# Patient Record
Sex: Female | Born: 1995
Health system: Southern US, Community
[De-identification: ages and names within clinical notes are randomized; demographics above are authoritative.]

## PROBLEM LIST (undated history)

## (undated) DIAGNOSIS — J302 Other seasonal allergic rhinitis: Secondary | ICD-10-CM

## (undated) HISTORY — PX: ADENOIDECTOMY: SUR15

---
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
## 2017-01-08 DIAGNOSIS — R5383 Other fatigue: Secondary | ICD-10-CM | POA: Diagnosis not present

## 2017-07-03 DIAGNOSIS — Z01419 Encounter for gynecological examination (general) (routine) without abnormal findings: Secondary | ICD-10-CM | POA: Diagnosis not present

## 2017-07-21 ENCOUNTER — Encounter: Payer: Self-pay | Admitting: Genetic Counselor

## 2017-07-21 ENCOUNTER — Telehealth: Payer: Self-pay | Admitting: Genetic Counselor

## 2017-07-21 NOTE — Telephone Encounter (Signed)
Genetic counseling appt has been scheduled for the pt to see Maylon Cos on 10/3 at 1pm. Address and insurance verified. Letter mailed to the pt and faxed to the referring.

## 2017-08-06 ENCOUNTER — Encounter: Payer: Self-pay | Admitting: Genetic Counselor

## 2017-08-06 ENCOUNTER — Other Ambulatory Visit: Payer: Self-pay

## 2017-08-31 DIAGNOSIS — Z23 Encounter for immunization: Secondary | ICD-10-CM | POA: Diagnosis not present

## 2017-09-23 DIAGNOSIS — H6120 Impacted cerumen, unspecified ear: Secondary | ICD-10-CM | POA: Diagnosis not present

## 2017-09-23 DIAGNOSIS — L989 Disorder of the skin and subcutaneous tissue, unspecified: Secondary | ICD-10-CM | POA: Diagnosis not present

## 2017-12-29 DIAGNOSIS — N760 Acute vaginitis: Secondary | ICD-10-CM | POA: Diagnosis not present

## 2018-02-05 DIAGNOSIS — L659 Nonscarring hair loss, unspecified: Secondary | ICD-10-CM | POA: Diagnosis not present

## 2018-02-05 DIAGNOSIS — L219 Seborrheic dermatitis, unspecified: Secondary | ICD-10-CM | POA: Diagnosis not present

## 2018-03-03 DIAGNOSIS — L659 Nonscarring hair loss, unspecified: Secondary | ICD-10-CM | POA: Diagnosis not present

## 2018-03-20 DIAGNOSIS — D649 Anemia, unspecified: Secondary | ICD-10-CM | POA: Diagnosis not present

## 2018-03-20 DIAGNOSIS — E559 Vitamin D deficiency, unspecified: Secondary | ICD-10-CM | POA: Diagnosis not present

## 2018-05-13 DIAGNOSIS — R51 Headache: Secondary | ICD-10-CM | POA: Diagnosis not present

## 2018-05-13 DIAGNOSIS — M9902 Segmental and somatic dysfunction of thoracic region: Secondary | ICD-10-CM | POA: Diagnosis not present

## 2018-05-13 DIAGNOSIS — M9901 Segmental and somatic dysfunction of cervical region: Secondary | ICD-10-CM | POA: Diagnosis not present

## 2018-05-15 DIAGNOSIS — M9901 Segmental and somatic dysfunction of cervical region: Secondary | ICD-10-CM | POA: Diagnosis not present

## 2018-05-15 DIAGNOSIS — M9902 Segmental and somatic dysfunction of thoracic region: Secondary | ICD-10-CM | POA: Diagnosis not present

## 2018-05-15 DIAGNOSIS — R51 Headache: Secondary | ICD-10-CM | POA: Diagnosis not present

## 2018-05-19 DIAGNOSIS — M9901 Segmental and somatic dysfunction of cervical region: Secondary | ICD-10-CM | POA: Diagnosis not present

## 2018-05-19 DIAGNOSIS — R51 Headache: Secondary | ICD-10-CM | POA: Diagnosis not present

## 2018-05-19 DIAGNOSIS — M9902 Segmental and somatic dysfunction of thoracic region: Secondary | ICD-10-CM | POA: Diagnosis not present

## 2018-05-21 DIAGNOSIS — R51 Headache: Secondary | ICD-10-CM | POA: Diagnosis not present

## 2018-05-21 DIAGNOSIS — M542 Cervicalgia: Secondary | ICD-10-CM | POA: Diagnosis not present

## 2018-05-21 DIAGNOSIS — M9901 Segmental and somatic dysfunction of cervical region: Secondary | ICD-10-CM | POA: Diagnosis not present

## 2018-05-22 DIAGNOSIS — R51 Headache: Secondary | ICD-10-CM | POA: Diagnosis not present

## 2018-05-22 DIAGNOSIS — M9901 Segmental and somatic dysfunction of cervical region: Secondary | ICD-10-CM | POA: Diagnosis not present

## 2018-05-22 DIAGNOSIS — M9902 Segmental and somatic dysfunction of thoracic region: Secondary | ICD-10-CM | POA: Diagnosis not present

## 2018-05-25 DIAGNOSIS — R51 Headache: Secondary | ICD-10-CM | POA: Diagnosis not present

## 2018-05-25 DIAGNOSIS — M9901 Segmental and somatic dysfunction of cervical region: Secondary | ICD-10-CM | POA: Diagnosis not present

## 2018-05-25 DIAGNOSIS — M9902 Segmental and somatic dysfunction of thoracic region: Secondary | ICD-10-CM | POA: Diagnosis not present

## 2018-05-27 DIAGNOSIS — M9901 Segmental and somatic dysfunction of cervical region: Secondary | ICD-10-CM | POA: Diagnosis not present

## 2018-05-27 DIAGNOSIS — M9902 Segmental and somatic dysfunction of thoracic region: Secondary | ICD-10-CM | POA: Diagnosis not present

## 2018-05-27 DIAGNOSIS — R51 Headache: Secondary | ICD-10-CM | POA: Diagnosis not present

## 2018-05-28 DIAGNOSIS — M9901 Segmental and somatic dysfunction of cervical region: Secondary | ICD-10-CM | POA: Diagnosis not present

## 2018-05-28 DIAGNOSIS — M542 Cervicalgia: Secondary | ICD-10-CM | POA: Diagnosis not present

## 2018-05-28 DIAGNOSIS — R51 Headache: Secondary | ICD-10-CM | POA: Diagnosis not present

## 2018-06-03 DIAGNOSIS — M9901 Segmental and somatic dysfunction of cervical region: Secondary | ICD-10-CM | POA: Diagnosis not present

## 2018-06-03 DIAGNOSIS — M9902 Segmental and somatic dysfunction of thoracic region: Secondary | ICD-10-CM | POA: Diagnosis not present

## 2018-06-03 DIAGNOSIS — R51 Headache: Secondary | ICD-10-CM | POA: Diagnosis not present

## 2018-06-04 DIAGNOSIS — R51 Headache: Secondary | ICD-10-CM | POA: Diagnosis not present

## 2018-06-04 DIAGNOSIS — M9901 Segmental and somatic dysfunction of cervical region: Secondary | ICD-10-CM | POA: Diagnosis not present

## 2018-06-04 DIAGNOSIS — M542 Cervicalgia: Secondary | ICD-10-CM | POA: Diagnosis not present

## 2018-06-10 DIAGNOSIS — M9901 Segmental and somatic dysfunction of cervical region: Secondary | ICD-10-CM | POA: Diagnosis not present

## 2018-06-10 DIAGNOSIS — M9902 Segmental and somatic dysfunction of thoracic region: Secondary | ICD-10-CM | POA: Diagnosis not present

## 2018-06-10 DIAGNOSIS — R51 Headache: Secondary | ICD-10-CM | POA: Diagnosis not present

## 2018-06-11 DIAGNOSIS — M542 Cervicalgia: Secondary | ICD-10-CM | POA: Diagnosis not present

## 2018-06-11 DIAGNOSIS — M9901 Segmental and somatic dysfunction of cervical region: Secondary | ICD-10-CM | POA: Diagnosis not present

## 2018-06-11 DIAGNOSIS — R51 Headache: Secondary | ICD-10-CM | POA: Diagnosis not present

## 2018-06-18 DIAGNOSIS — M9902 Segmental and somatic dysfunction of thoracic region: Secondary | ICD-10-CM | POA: Diagnosis not present

## 2018-06-18 DIAGNOSIS — R51 Headache: Secondary | ICD-10-CM | POA: Diagnosis not present

## 2018-06-18 DIAGNOSIS — M9901 Segmental and somatic dysfunction of cervical region: Secondary | ICD-10-CM | POA: Diagnosis not present

## 2018-06-22 DIAGNOSIS — M9901 Segmental and somatic dysfunction of cervical region: Secondary | ICD-10-CM | POA: Diagnosis not present

## 2018-06-22 DIAGNOSIS — R51 Headache: Secondary | ICD-10-CM | POA: Diagnosis not present

## 2018-06-22 DIAGNOSIS — M9902 Segmental and somatic dysfunction of thoracic region: Secondary | ICD-10-CM | POA: Diagnosis not present

## 2018-07-07 DIAGNOSIS — M9902 Segmental and somatic dysfunction of thoracic region: Secondary | ICD-10-CM | POA: Diagnosis not present

## 2018-07-07 DIAGNOSIS — R51 Headache: Secondary | ICD-10-CM | POA: Diagnosis not present

## 2018-07-07 DIAGNOSIS — M9901 Segmental and somatic dysfunction of cervical region: Secondary | ICD-10-CM | POA: Diagnosis not present

## 2018-07-14 DIAGNOSIS — R51 Headache: Secondary | ICD-10-CM | POA: Diagnosis not present

## 2018-07-14 DIAGNOSIS — M9901 Segmental and somatic dysfunction of cervical region: Secondary | ICD-10-CM | POA: Diagnosis not present

## 2018-07-14 DIAGNOSIS — M9902 Segmental and somatic dysfunction of thoracic region: Secondary | ICD-10-CM | POA: Diagnosis not present

## 2018-07-21 DIAGNOSIS — M9901 Segmental and somatic dysfunction of cervical region: Secondary | ICD-10-CM | POA: Diagnosis not present

## 2018-07-21 DIAGNOSIS — M9902 Segmental and somatic dysfunction of thoracic region: Secondary | ICD-10-CM | POA: Diagnosis not present

## 2018-07-21 DIAGNOSIS — R51 Headache: Secondary | ICD-10-CM | POA: Diagnosis not present

## 2018-07-28 DIAGNOSIS — M9901 Segmental and somatic dysfunction of cervical region: Secondary | ICD-10-CM | POA: Diagnosis not present

## 2018-07-28 DIAGNOSIS — M9902 Segmental and somatic dysfunction of thoracic region: Secondary | ICD-10-CM | POA: Diagnosis not present

## 2018-07-28 DIAGNOSIS — R51 Headache: Secondary | ICD-10-CM | POA: Diagnosis not present

## 2018-08-06 DIAGNOSIS — M9901 Segmental and somatic dysfunction of cervical region: Secondary | ICD-10-CM | POA: Diagnosis not present

## 2018-08-06 DIAGNOSIS — M9902 Segmental and somatic dysfunction of thoracic region: Secondary | ICD-10-CM | POA: Diagnosis not present

## 2018-08-06 DIAGNOSIS — R51 Headache: Secondary | ICD-10-CM | POA: Diagnosis not present

## 2018-08-13 DIAGNOSIS — R51 Headache: Secondary | ICD-10-CM | POA: Diagnosis not present

## 2018-08-13 DIAGNOSIS — M9901 Segmental and somatic dysfunction of cervical region: Secondary | ICD-10-CM | POA: Diagnosis not present

## 2018-08-13 DIAGNOSIS — M9902 Segmental and somatic dysfunction of thoracic region: Secondary | ICD-10-CM | POA: Diagnosis not present

## 2018-08-21 DIAGNOSIS — M9901 Segmental and somatic dysfunction of cervical region: Secondary | ICD-10-CM | POA: Diagnosis not present

## 2018-08-21 DIAGNOSIS — M9902 Segmental and somatic dysfunction of thoracic region: Secondary | ICD-10-CM | POA: Diagnosis not present

## 2018-08-21 DIAGNOSIS — R51 Headache: Secondary | ICD-10-CM | POA: Diagnosis not present

## 2018-09-05 ENCOUNTER — Other Ambulatory Visit: Payer: Self-pay

## 2018-09-05 ENCOUNTER — Encounter (HOSPITAL_BASED_OUTPATIENT_CLINIC_OR_DEPARTMENT_OTHER): Payer: Self-pay | Admitting: *Deleted

## 2018-09-05 DIAGNOSIS — Z79899 Other long term (current) drug therapy: Secondary | ICD-10-CM | POA: Diagnosis not present

## 2018-09-05 DIAGNOSIS — N764 Abscess of vulva: Secondary | ICD-10-CM | POA: Diagnosis not present

## 2018-09-05 DIAGNOSIS — N7689 Other specified inflammation of vagina and vulva: Secondary | ICD-10-CM | POA: Diagnosis not present

## 2018-09-05 DIAGNOSIS — J301 Allergic rhinitis due to pollen: Secondary | ICD-10-CM | POA: Diagnosis not present

## 2018-09-05 NOTE — ED Triage Notes (Signed)
Pt reports ?insect bite to right labia today

## 2018-09-06 ENCOUNTER — Emergency Department (HOSPITAL_BASED_OUTPATIENT_CLINIC_OR_DEPARTMENT_OTHER)
Admission: EM | Admit: 2018-09-06 | Discharge: 2018-09-06 | Disposition: A | Discharge status: Home / Self Care | Payer: 59 | Attending: Emergency Medicine | Admitting: Emergency Medicine

## 2018-09-06 DIAGNOSIS — N764 Abscess of vulva: Secondary | ICD-10-CM

## 2018-09-06 MED ORDER — CEPHALEXIN 250 MG PO CAPS
500.0000 mg | ORAL_CAPSULE | Freq: Once | ORAL | Status: AC
  Administered 2018-09-06: 500 mg via ORAL
  Filled 2018-09-06: qty 2

## 2018-09-06 MED ORDER — CEPHALEXIN 500 MG PO CAPS: 500.0000 mg | ORAL_CAPSULE | Freq: Four times a day (QID) | ORAL | 0 refills | Status: DC

## 2018-09-06 NOTE — ED Provider Notes (Signed)
TIME SEEN: 1:09 AM  CHIEF COMPLAINT: Labial swelling  HPI: Patient is a 22 year old female who presents to the emergency department with right-sided labial swelling that she noticed today.  Initially told triage nurse that she thinks she was bit by an insect.  She does not remember anything biting her in this area but just thought that this was a possibility given the swelling.  No vaginal bleeding or discharge.  Last menstrual period was August 15, 2018.  Has never had a history of the same.  ROS: See HPI Constitutional: no fever  Eyes: no drainage  ENT: no runny nose   Cardiovascular:  no chest pain  Resp: no SOB  GI: no vomiting GU: no dysuria Integumentary: no rash  Allergy: no hives  Musculoskeletal: no leg swelling  Neurological: no slurred speech ROS otherwise negative  PAST MEDICAL HISTORY/PAST SURGICAL HISTORY:  Past Medical History:  Diagnosis Date  . Seasonal allergies     MEDICATIONS:  Prior to Admission medications   Medication Sig Start Date End Date Taking? Authorizing Provider  fexofenadine (ALLEGRA) 180 MG tablet Take 180 mg by mouth daily.   Yes [provider]  IRON PO Take by mouth.   Yes [provider]  Multiple Vitamin (MULTIVITAMIN) capsule Take 1 capsule by mouth daily.   Yes [provider]  Cetirizine HCl (ZYRTEC PO) Take by mouth.    [provider]  methocarbamol (ROBAXIN) 500 MG tablet Take 1 tablet (500 mg total) by mouth every 6 (six) hours as needed for muscle spasms. 08/04/16   Alene Mires, NP    ALLERGIES:  Allergies  Allergen Reactions  . Hydrocodone Other (See Comments)    Hives    SOCIAL HISTORY:  Social History   Tobacco Use  . Smoking status: Never Smoker  . Smokeless tobacco: Never Used  Substance Use Topics  . Alcohol use: Yes    Comment: occasional    FAMILY HISTORY: No family history on file.  EXAM: BP 123/83 (BP Location: Left Arm)   Pulse 83   Temp 98.3 F (36.8 C)  (Oral)   Resp 16   Ht 5\' 4"  (1.626 m)   Wt 49.9 kg   LMP 08/15/2018 (Approximate)   SpO2 100%   BMI 18.88 kg/m  CONSTITUTIONAL: Alert and oriented and responds appropriately to questions. Well-appearing; well-nourished HEAD: Normocephalic EYES: Conjunctivae clear, pupils appear equal, EOMI ENT: normal nose; moist mucous membranes NECK: Supple, no meningismus, no nuchal rigidity, no LAD  CARD: RRR; S1 and S2 appreciated; no murmurs, no clicks, no rubs, no gallops RESP: Normal chest excursion without splinting or tachypnea; breath sounds clear and equal bilaterally; no wheezes, no rhonchi, no rales, no hypoxia or respiratory distress, speaking full sentences ABD/GI: Normal bowel sounds; non-distended; soft, non-tender, no rebound, no guarding, no peritoneal signs, no hepatosplenomegaly GU: Patient has swelling to the right labia majora without redness, fluctuance, induration.  No vaginal bleeding or discharge on examination. BACK:  The back appears normal and is non-tender to palpation, there is no CVA tenderness EXT: Normal ROM in all joints; non-tender to palpation; no edema; normal capillary refill; no cyanosis, no calf tenderness or swelling    SKIN: Normal color for age and race; warm; no rash NEURO: Moves all extremities equally PSYCH: The patient's mood and manner are appropriate. Grooming and personal hygiene are appropriate.  MEDICAL DECISION MAKING: Patient here with likely start of an early abscess.  She does have swelling of the labia majora but no obvious  abscess for drainage at this time.  There is no fluctuance or signs of cellulitis.  Will discharge on Keflex and given outpatient close follow-up.  Recommended alternating Tylenol and Motrin for pain.  Discussed warm sitz bath's and return precautions.  Patient and mother comfortable with this plan.  At this time, I do not feel there is any life-threatening condition present. I have reviewed and discussed all results (EKG,  imaging, lab, urine as appropriate) and exam findings with patient/family. I have reviewed nursing notes and appropriate previous records.  I feel the patient is safe to be discharged home without further emergent workup and can continue workup as an outpatient as needed. Discussed usual and customary return precautions. Patient/family verbalize understanding and are comfortable with this plan.  Outpatient follow-up has been provided if needed. All questions have been answered.      Bena Kobel, Layla Maw, DO 09/06/18 [phone]

## 2018-09-06 NOTE — Discharge Instructions (Addendum)
You may alternate Tylenol 1000 mg every 6 hours as needed for pain and Ibuprofen 800 mg every 8 hours as needed for pain.  Please take Ibuprofen with food.   To find a primary care or specialty doctor please call [phone] or [phone] to access "[facility] Find a Doctor Service."  You may also go on the [facility] website at [email or web address]  There are also multiple [facility], [facility], [facility] and [facility] practices throughout [place] that are frequently accepting new patients. You may find a clinic that is close to your home and contact them.  [facility] -  [address] [place] [address] [phone]   [facility] -  [address] [place] [address] [phone]   [facility] - [address] [place] [address]  [place] [address] [phone]   [facility] at [facility] [address] [place], [place] [phone]  [facility] [address] [place]  [address] [place], [place] [phone]   [facility] [address] [place] [address] [place], [place] [phone]  [facility] [address] [place], [place] [phone]   [facility] [address] [place] [address] [place], [place] [phone]   [facility] [address] [place], [place] [phone]   [facility] [address] [place], [place] [phone]   [facility] [address] [place], [place] [phone]

## 2018-09-10 DIAGNOSIS — M9902 Segmental and somatic dysfunction of thoracic region: Secondary | ICD-10-CM | POA: Diagnosis not present

## 2018-09-10 DIAGNOSIS — M9901 Segmental and somatic dysfunction of cervical region: Secondary | ICD-10-CM | POA: Diagnosis not present

## 2018-09-10 DIAGNOSIS — R51 Headache: Secondary | ICD-10-CM | POA: Diagnosis not present

## 2018-09-24 DIAGNOSIS — M9901 Segmental and somatic dysfunction of cervical region: Secondary | ICD-10-CM | POA: Diagnosis not present

## 2018-09-24 DIAGNOSIS — M9902 Segmental and somatic dysfunction of thoracic region: Secondary | ICD-10-CM | POA: Diagnosis not present

## 2018-09-24 DIAGNOSIS — R51 Headache: Secondary | ICD-10-CM | POA: Diagnosis not present

## 2018-10-06 DIAGNOSIS — Z01419 Encounter for gynecological examination (general) (routine) without abnormal findings: Secondary | ICD-10-CM | POA: Diagnosis not present

## 2018-10-06 DIAGNOSIS — Z681 Body mass index (BMI) 19 or less, adult: Secondary | ICD-10-CM | POA: Diagnosis not present

## 2018-10-08 DIAGNOSIS — M9902 Segmental and somatic dysfunction of thoracic region: Secondary | ICD-10-CM | POA: Diagnosis not present

## 2018-10-08 DIAGNOSIS — M9901 Segmental and somatic dysfunction of cervical region: Secondary | ICD-10-CM | POA: Diagnosis not present

## 2018-10-08 DIAGNOSIS — R51 Headache: Secondary | ICD-10-CM | POA: Diagnosis not present

## 2018-10-22 DIAGNOSIS — R51 Headache: Secondary | ICD-10-CM | POA: Diagnosis not present

## 2018-10-22 DIAGNOSIS — M9902 Segmental and somatic dysfunction of thoracic region: Secondary | ICD-10-CM | POA: Diagnosis not present

## 2018-10-22 DIAGNOSIS — M9901 Segmental and somatic dysfunction of cervical region: Secondary | ICD-10-CM | POA: Diagnosis not present

## 2018-11-12 DIAGNOSIS — M9902 Segmental and somatic dysfunction of thoracic region: Secondary | ICD-10-CM | POA: Diagnosis not present

## 2018-11-12 DIAGNOSIS — R51 Headache: Secondary | ICD-10-CM | POA: Diagnosis not present

## 2018-11-12 DIAGNOSIS — M9901 Segmental and somatic dysfunction of cervical region: Secondary | ICD-10-CM | POA: Diagnosis not present

## 2019-03-08 DIAGNOSIS — R51 Headache: Secondary | ICD-10-CM | POA: Diagnosis not present

## 2019-03-08 DIAGNOSIS — M9901 Segmental and somatic dysfunction of cervical region: Secondary | ICD-10-CM | POA: Diagnosis not present

## 2019-03-08 DIAGNOSIS — M9902 Segmental and somatic dysfunction of thoracic region: Secondary | ICD-10-CM | POA: Diagnosis not present

## 2019-03-10 DIAGNOSIS — M9902 Segmental and somatic dysfunction of thoracic region: Secondary | ICD-10-CM | POA: Diagnosis not present

## 2019-03-10 DIAGNOSIS — M9901 Segmental and somatic dysfunction of cervical region: Secondary | ICD-10-CM | POA: Diagnosis not present

## 2019-03-10 DIAGNOSIS — R51 Headache: Secondary | ICD-10-CM | POA: Diagnosis not present

## 2019-03-15 DIAGNOSIS — M9902 Segmental and somatic dysfunction of thoracic region: Secondary | ICD-10-CM | POA: Diagnosis not present

## 2019-03-15 DIAGNOSIS — M9901 Segmental and somatic dysfunction of cervical region: Secondary | ICD-10-CM | POA: Diagnosis not present

## 2019-03-15 DIAGNOSIS — R51 Headache: Secondary | ICD-10-CM | POA: Diagnosis not present

## 2022-01-28 ENCOUNTER — Other Ambulatory Visit: Payer: Self-pay

## 2022-01-28 ENCOUNTER — Encounter (HOSPITAL_BASED_OUTPATIENT_CLINIC_OR_DEPARTMENT_OTHER): Payer: Self-pay

## 2022-01-28 ENCOUNTER — Emergency Department (HOSPITAL_BASED_OUTPATIENT_CLINIC_OR_DEPARTMENT_OTHER)
Admission: EM | Admit: 2022-01-28 | Discharge: 2022-01-28 | Disposition: A | Discharge status: Home / Self Care | Payer: 59 | Attending: Emergency Medicine | Admitting: Emergency Medicine

## 2022-01-28 ENCOUNTER — Emergency Department (HOSPITAL_BASED_OUTPATIENT_CLINIC_OR_DEPARTMENT_OTHER): Payer: 59

## 2022-01-28 DIAGNOSIS — R42 Dizziness and giddiness: Secondary | ICD-10-CM | POA: Diagnosis not present

## 2022-01-28 DIAGNOSIS — R7989 Other specified abnormal findings of blood chemistry: Secondary | ICD-10-CM | POA: Diagnosis not present

## 2022-01-28 DIAGNOSIS — H9209 Otalgia, unspecified ear: Secondary | ICD-10-CM | POA: Diagnosis not present

## 2022-01-28 DIAGNOSIS — R509 Fever, unspecified: Secondary | ICD-10-CM | POA: Diagnosis present

## 2022-01-28 DIAGNOSIS — Z20822 Contact with and (suspected) exposure to covid-19: Secondary | ICD-10-CM | POA: Insufficient documentation

## 2022-01-28 DIAGNOSIS — B2790 Infectious mononucleosis, unspecified without complication: Secondary | ICD-10-CM | POA: Insufficient documentation

## 2022-01-28 DIAGNOSIS — R000 Tachycardia, unspecified: Secondary | ICD-10-CM | POA: Insufficient documentation

## 2022-01-28 LAB — URINALYSIS, ROUTINE W REFLEX MICROSCOPIC
Bilirubin Urine: NEGATIVE
Glucose, UA: NEGATIVE mg/dL
Ketones, ur: 5 mg/dL — AB
Leukocytes,Ua: NEGATIVE
Nitrite: NEGATIVE
Protein, ur: NEGATIVE mg/dL
Specific Gravity, Urine: 1.015 (ref 1.005–1.030)
pH: 7 (ref 5.0–8.0)

## 2022-01-28 LAB — HEPATITIS PANEL, ACUTE
HCV Ab: NONREACTIVE
Hep A IgM: NONREACTIVE
Hep B C IgM: NONREACTIVE
Hepatitis B Surface Ag: NONREACTIVE

## 2022-01-28 LAB — RESPIRATORY PANEL BY PCR

## 2022-01-28 LAB — CBC WITH DIFFERENTIAL/PLATELET
Abs Immature Granulocytes: 0.07 10*3/uL (ref 0.00–0.07)
Basophils Absolute: 0 10*3/uL (ref 0.0–0.1)
Basophils Relative: 0 %
Eosinophils Absolute: 0 10*3/uL (ref 0.0–0.5)
Eosinophils Relative: 0 %
HCT: 30 % — ABNORMAL LOW (ref 36.0–46.0)
Hemoglobin: 11 g/dL — ABNORMAL LOW (ref 12.0–15.0)
Immature Granulocytes: 1 %
Lymphocytes Relative: 50 %
Lymphs Abs: 5.9 10*3/uL — ABNORMAL HIGH (ref 0.7–4.0)
MCH: 30.6 pg (ref 26.0–34.0)
MCHC: 36.7 g/dL — ABNORMAL HIGH (ref 30.0–36.0)
MCV: 83.6 fL (ref 80.0–100.0)
Monocytes Absolute: 1.9 10*3/uL — ABNORMAL HIGH (ref 0.1–1.0)
Monocytes Relative: 16 %
Neutro Abs: 3.9 10*3/uL (ref 1.7–7.7)
Neutrophils Relative %: 33 %
Platelets: 142 10*3/uL — ABNORMAL LOW (ref 150–400)
RBC: 3.59 MIL/uL — ABNORMAL LOW (ref 3.87–5.11)
RDW: 12.3 % (ref 11.5–15.5)
WBC: 11.9 10*3/uL — ABNORMAL HIGH (ref 4.0–10.5)
nRBC: 0 % (ref 0.0–0.2)

## 2022-01-28 LAB — URINALYSIS, MICROSCOPIC (REFLEX)

## 2022-01-28 LAB — COMPREHENSIVE METABOLIC PANEL
ALT: 487 U/L — ABNORMAL HIGH (ref 0–44)
AST: 468 U/L — ABNORMAL HIGH (ref 15–41)
Albumin: 3.2 g/dL — ABNORMAL LOW (ref 3.5–5.0)
Alkaline Phosphatase: 132 U/L — ABNORMAL HIGH (ref 38–126)
Anion gap: 9 (ref 5–15)
BUN: 9 mg/dL (ref 6–20)
CO2: 23 mmol/L (ref 22–32)
Calcium: 8.1 mg/dL — ABNORMAL LOW (ref 8.9–10.3)
Chloride: 100 mmol/L (ref 98–111)
Creatinine, Ser: 0.72 mg/dL (ref 0.44–1.00)
GFR, Estimated: >60 mL/min (ref >60)
Glucose, Bld: 98 mg/dL (ref 70–99)
Potassium: 3.4 mmol/L — ABNORMAL LOW (ref 3.5–5.1)
Sodium: 132 mmol/L — ABNORMAL LOW (ref 135–145)
Total Bilirubin: 1.1 mg/dL (ref 0.3–1.2)
Total Protein: 6.9 g/dL (ref 6.5–8.1)

## 2022-01-28 LAB — GROUP A STREP BY PCR: Group A Strep by PCR: NOT DETECTED

## 2022-01-28 LAB — MONONUCLEOSIS SCREEN: Mono Screen: POSITIVE — AB

## 2022-01-28 LAB — ACETAMINOPHEN LEVEL: Acetaminophen (Tylenol), Serum: 23 ug/mL (ref 10–30)

## 2022-01-28 LAB — RESP PANEL BY RT-PCR (FLU A&B, COVID) ARPGX2
Influenza A by PCR: NEGATIVE
Influenza B by PCR: NEGATIVE
SARS Coronavirus 2 by RT PCR: NEGATIVE

## 2022-01-28 LAB — PREGNANCY, URINE: Preg Test, Ur: NEGATIVE

## 2022-01-28 MED ORDER — ONDANSETRON HCL 4 MG/2ML IJ SOLN
4.0000 mg | Freq: Once | INTRAMUSCULAR | Status: AC
  Administered 2022-01-28: 4 mg via INTRAVENOUS
  Filled 2022-01-28: qty 2

## 2022-01-28 MED ORDER — ONDANSETRON 4 MG PO TBDP: ORAL_TABLET | ORAL | 0 refills | Status: AC

## 2022-01-28 MED ORDER — ACETAMINOPHEN 500 MG PO TABS
1000.0000 mg | ORAL_TABLET | Freq: Once | ORAL | Status: AC
  Administered 2022-01-28: 1000 mg via ORAL
  Filled 2022-01-28: qty 2

## 2022-01-28 MED ORDER — LACTATED RINGERS IV BOLUS
1000.0000 mL | Freq: Once | INTRAVENOUS | Status: AC
  Administered 2022-01-28: 1000 mL via INTRAVENOUS

## 2022-01-28 MED ORDER — KETOROLAC TROMETHAMINE 30 MG/ML IJ SOLN
15.0000 mg | Freq: Once | INTRAMUSCULAR | Status: AC
  Administered 2022-01-28: 15 mg via INTRAVENOUS
  Filled 2022-01-28: qty 1

## 2022-01-28 NOTE — Discharge Instructions (Signed)
AST 15 - 41 U/L 468 High    ?ALT 0 - 44 U/L 487 High   ? ? ?Your liver enzymes today. Please get them rechecked by your primary doctor when you start to feel better.  ?

## 2022-01-28 NOTE — ED Triage Notes (Addendum)
Patient states she has been feeling generally ill for 1 week.  Patient states she had blood work drawn at her PCP office Friday, which has not come back.  Patient states she has had a fever for 3-4 days.  She has had intermittent dizziness, and had one episode of vomiting about 0130.  She feels she may now be dehydrated.  Did not take any medication pta.  Patient also complains of swollen lymph nodes to R side of her neck.  ?

## 2022-01-28 NOTE — ED Provider Notes (Signed)
?[facility] EMERGENCY DEPARTMENT ?Provider Note ? ? ?CSN: [id number] ?Arrival date & time: 01/28/22  [id number] ? ?  ? ?History ? ?Chief Complaint  ?Patient presents with  ? Fever  ? Dizziness  ? Lymphadenopathy  ? Emesis  ?  X1 episode   ? ? ?Monique White is a 26 y.o. female. ? ?26 year old female whose had 1 week of lymphadenopathy and now has had 4 days of night sweats with night fevers as well.  She also states her right ear feels full.  She has had a headache today that feels like fullness in her sinuses.  This is coming gone over the last couple days as well. saw her doctor on Friday the CBC but she does not know the results for it.  ? ? ?Fever ?Temp source:  Oral ?Severity:  Moderate ?Duration:  3 days ?Timing:  Intermittent ?Progression:  Worsening ?Chronicity:  New ?Ineffective treatments:  None tried ?Associated symptoms: chills, congestion, ear pain, headaches, myalgias, nausea and vomiting   ?Associated symptoms: no chest pain, no cough, no diarrhea, no dysuria and no sore throat   ?Dizziness ?Associated symptoms: headaches, nausea and vomiting   ?Associated symptoms: no chest pain and no diarrhea   ?Emesis ?Associated symptoms: chills, fever, headaches and myalgias   ?Associated symptoms: no cough, no diarrhea and no sore throat   ? ?  ? ?Home Medications ?Prior to Admission medications   ?Medication Sig Start Date End Date Taking? Authorizing Provider  ?ondansetron (ZOFRAN-ODT) 4 MG disintegrating tablet 4mg  ODT q4 hours prn nausea/vomit 01/28/22  Yes Aliceson Dolbow, Barbara CowerJason, MD  ?cephALEXin (KEFLEX) 500 MG capsule Take 1 capsule (500 mg total) by mouth 4 (four) times daily. 09/06/18   Ward, Layla MawKristen N, DO  ?Cetirizine HCl (ZYRTEC PO) Take by mouth.    [provider]  ?fexofenadine (ALLEGRA) 180 MG tablet Take 180 mg by mouth daily.    [provider]  ?IRON PO Take by mouth.    [provider]  ?methocarbamol (ROBAXIN) 500 MG tablet Take 1 tablet (500 mg total) by mouth every 6  (six) hours as needed for muscle spasms. 08/04/16   Alene Miresmohundro, Jennifer C, NP  ?Multiple Vitamin (MULTIVITAMIN) capsule Take 1 capsule by mouth daily.    [provider]  ?   ? ?Allergies    ?Hydrocodone   ? ?Review of Systems   ?Review of Systems  ?Constitutional:  Positive for chills and fever.  ?HENT:  Positive for congestion and ear pain. Negative for sore throat.   ?Respiratory:  Negative for cough.   ?Cardiovascular:  Negative for chest pain.  ?Gastrointestinal:  Positive for nausea and vomiting. Negative for diarrhea.  ?Genitourinary:  Negative for dysuria.  ?Musculoskeletal:  Positive for myalgias.  ?Neurological:  Positive for dizziness and headaches.  ? ?Physical Exam ?Updated Vital Signs ?BP 97/66   Pulse (!) 112   Temp (!) 100.5 ?F (38.1 ?C) (Oral)   Resp (!) 23   Ht 5\' 4"  (1.626 m)   Wt 56.6 kg   LMP 01/23/2022 (Approximate)   SpO2 100%   BMI 21.40 kg/m?  ?Physical Exam ?Vitals and nursing note reviewed.  ?Constitutional:   ?   Appearance: She is well-developed.  ?HENT:  ?   Head: Normocephalic and atraumatic.  ?   Mouth/Throat:  ?   Mouth: Mucous membranes are dry.  ?   Pharynx: Oropharynx is clear.  ?Eyes:  ?   Conjunctiva/sclera: Conjunctivae normal.  ?   Pupils: Pupils are equal, round,  and reactive to light.  ?Cardiovascular:  ?   Rate and Rhythm: Regular rhythm. Tachycardia present.  ?Pulmonary:  ?   Effort: No respiratory distress.  ?   Breath sounds: No stridor.  ?Abdominal:  ?   General: Abdomen is flat. There is no distension.  ?Musculoskeletal:     ?   General: No swelling or tenderness. Normal range of motion.  ?   Cervical back: Normal range of motion.  ?Skin: ?   General: Skin is warm and dry.  ?Neurological:  ?   General: No focal deficit present.  ?   Mental Status: She is alert.  ? ? ?ED Results / Procedures / Treatments   ?Labs ?(all labs ordered are listed, but only abnormal results are displayed) ?Labs Reviewed  ?URINALYSIS, ROUTINE W REFLEX MICROSCOPIC - Abnormal;  Notable for the following components:  ?    Result Value  ? Hgb urine dipstick LARGE (*)   ? Ketones, ur 5 (*)   ? All other components within normal limits  ?CBC WITH DIFFERENTIAL/PLATELET - Abnormal; Notable for the following components:  ? WBC 11.9 (*)   ? RBC 3.59 (*)   ? Hemoglobin 11.0 (*)   ? HCT 30.0 (*)   ? MCHC 36.7 (*)   ? Platelets 142 (*)   ? Lymphs Abs 5.9 (*)   ? Monocytes Absolute 1.9 (*)   ? All other components within normal limits  ?COMPREHENSIVE METABOLIC PANEL - Abnormal; Notable for the following components:  ? Sodium 132 (*)   ? Potassium 3.4 (*)   ? Calcium 8.1 (*)   ? Albumin 3.2 (*)   ? AST 468 (*)   ? ALT 487 (*)   ? Alkaline Phosphatase 132 (*)   ? All other components within normal limits  ?URINALYSIS, MICROSCOPIC (REFLEX) - Abnormal; Notable for the following components:  ? Bacteria, UA FEW (*)   ? All other components within normal limits  ?MONONUCLEOSIS SCREEN - Abnormal; Notable for the following components:  ? Mono Screen POSITIVE (*)   ? All other components within normal limits  ?RESP PANEL BY RT-PCR (FLU A&B, COVID) ARPGX2  ?GROUP A STREP BY PCR  ?RESPIRATORY PANEL BY PCR  ?PREGNANCY, URINE  ?ACETAMINOPHEN LEVEL  ?HEPATITIS PANEL, ACUTE  ? ? ?EKG ?None ? ?Radiology ?DG Chest Portable 1 View ? ?Result Date: 01/28/2022 ?CLINICAL DATA:  "Evaluation " EXAM: PORTABLE CHEST 1 VIEW COMPARISON:  None. FINDINGS: The heart size and mediastinal contours are within normal limits. Both lungs are clear. The visualized skeletal structures are unremarkable. IMPRESSION: Negative portable chest. Electronically Signed   By: Tiburcio Pea M.D.   On: 01/28/2022 05:19   ? ?Procedures ?Procedures  ? ? ?Medications Ordered in ED ?Medications  ?acetaminophen (TYLENOL) tablet 1,000 mg (1,000 mg Oral Given 01/28/22 0357)  ?lactated ringers bolus 1,000 mL (0 mLs Intravenous Stopped 01/28/22 0624)  ?ondansetron [facility]) injection 4 mg (4 mg Intravenous Given 01/28/22 0510)  ?ketorolac (TORADOL) 30 MG/ML  injection 15 mg (15 mg Intravenous Given 01/28/22 0510)  ? ? ?ED Course/ Medical Decision Making/ A&P ?  ?                        ?Medical Decision Making ?Amount and/or Complexity of Data Reviewed ?Labs: ordered. ?Radiology: ordered. ? ?Risk ?OTC drugs. ?Prescription drug management. ? ? ?Overall patient appears well.  I suspect she passed a couple of upper respiratory infection and its causing her lymphadenopathy.  She  does appear to be dehydrated on exam today as we give her some fluids and check her basic labs.  Overall she appears well think she will be able to be discharged if she is feeling better tolerating p.o. ? ?She was ultimately found to have mono.  This is likely the cause of her elevated liver enzymes but she does usually get this rechecked.  She also knows not to use Tylenol until those improve.  Her mother was with her the whole time and she understands these directions as well.   ?  ?Final Clinical Impression(s) / ED Diagnoses ?Final diagnoses:  ?Infectious mononucleosis without complication, infectious mononucleosis due to unspecified organism  ?Elevated LFTs  ? ? ?Rx / DC Orders ?ED Discharge Orders   ? ?      Ordered  ?  ondansetron (ZOFRAN-ODT) 4 MG disintegrating tablet       ? 01/28/22 0640  ? ?  ?  ? ?  ? ? ?  ?Marily Memos, MD ?01/28/22 [phone] ? ?

## 2022-01-28 NOTE — ED Notes (Signed)
Patient discharged to home.  All discharge instructions reviewed.  Patient verbalized understanding via teachback method.  VS WDL.  Respirations even and unlabored.  Ambulatory out of ED.   °

## 2022-02-02 ENCOUNTER — Encounter (HOSPITAL_BASED_OUTPATIENT_CLINIC_OR_DEPARTMENT_OTHER): Payer: Self-pay | Admitting: Emergency Medicine

## 2022-02-02 ENCOUNTER — Emergency Department (HOSPITAL_BASED_OUTPATIENT_CLINIC_OR_DEPARTMENT_OTHER)
Admission: EM | Admit: 2022-02-02 | Discharge: 2022-02-03 | Disposition: A | Discharge status: Home / Self Care | Payer: 59 | Attending: Emergency Medicine | Admitting: Emergency Medicine

## 2022-02-02 ENCOUNTER — Other Ambulatory Visit: Payer: Self-pay

## 2022-02-02 DIAGNOSIS — R509 Fever, unspecified: Secondary | ICD-10-CM | POA: Diagnosis present

## 2022-02-02 DIAGNOSIS — R61 Generalized hyperhidrosis: Secondary | ICD-10-CM | POA: Diagnosis not present

## 2022-02-02 DIAGNOSIS — E860 Dehydration: Secondary | ICD-10-CM | POA: Diagnosis not present

## 2022-02-02 DIAGNOSIS — B2790 Infectious mononucleosis, unspecified without complication: Secondary | ICD-10-CM | POA: Diagnosis not present

## 2022-02-02 DIAGNOSIS — R042 Hemoptysis: Secondary | ICD-10-CM | POA: Insufficient documentation

## 2022-02-02 DIAGNOSIS — R945 Abnormal results of liver function studies: Secondary | ICD-10-CM | POA: Diagnosis not present

## 2022-02-02 NOTE — ED Triage Notes (Signed)
Pt states +mono Monday. Seen at walk-in clinic today with hemoptysis. Per mother the MD stated he wanted to treat for strep despite a negative rapid strep. C/o fatigue, sore throat, and generalized weakness.  ?

## 2022-02-03 ENCOUNTER — Emergency Department (HOSPITAL_BASED_OUTPATIENT_CLINIC_OR_DEPARTMENT_OTHER): Payer: 59

## 2022-02-03 LAB — CBC WITH DIFFERENTIAL/PLATELET
Abs Immature Granulocytes: 0.08 10*3/uL — ABNORMAL HIGH (ref 0.00–0.07)
Basophils Absolute: 0.2 10*3/uL — ABNORMAL HIGH (ref 0.0–0.1)
Basophils Relative: 1 %
Eosinophils Absolute: 0 10*3/uL (ref 0.0–0.5)
Eosinophils Relative: 0 %
HCT: 33.7 % — ABNORMAL LOW (ref 36.0–46.0)
Hemoglobin: 11.6 g/dL — ABNORMAL LOW (ref 12.0–15.0)
Immature Granulocytes: 0 %
Lymphocytes Relative: 62 %
Lymphs Abs: 11.2 10*3/uL — ABNORMAL HIGH (ref 0.7–4.0)
MCH: 29.1 pg (ref 26.0–34.0)
MCHC: 34.4 g/dL (ref 30.0–36.0)
MCV: 84.7 fL (ref 80.0–100.0)
Monocytes Absolute: 1.4 10*3/uL — ABNORMAL HIGH (ref 0.1–1.0)
Monocytes Relative: 8 %
Neutro Abs: 5.4 10*3/uL (ref 1.7–7.7)
Neutrophils Relative %: 29 %
Platelets: 275 10*3/uL (ref 150–400)
RBC: 3.98 MIL/uL (ref 3.87–5.11)
RDW: 13.2 % (ref 11.5–15.5)
WBC: 18.4 10*3/uL — ABNORMAL HIGH (ref 4.0–10.5)
nRBC: 0 % (ref 0.0–0.2)

## 2022-02-03 LAB — COMPREHENSIVE METABOLIC PANEL
ALT: 204 U/L — ABNORMAL HIGH (ref 0–44)
AST: 78 U/L — ABNORMAL HIGH (ref 15–41)
Albumin: 3.8 g/dL (ref 3.5–5.0)
Alkaline Phosphatase: 107 U/L (ref 38–126)
Anion gap: 10 (ref 5–15)
BUN: 7 mg/dL (ref 6–20)
CO2: 24 mmol/L (ref 22–32)
Calcium: 8.9 mg/dL (ref 8.9–10.3)
Chloride: 98 mmol/L (ref 98–111)
Creatinine, Ser: 0.7 mg/dL (ref 0.44–1.00)
GFR, Estimated: >60 mL/min (ref >60)
Glucose, Bld: 95 mg/dL (ref 70–99)
Potassium: 4.3 mmol/L (ref 3.5–5.1)
Sodium: 132 mmol/L — ABNORMAL LOW (ref 135–145)
Total Bilirubin: 0.8 mg/dL (ref 0.3–1.2)
Total Protein: 8.4 g/dL — ABNORMAL HIGH (ref 6.5–8.1)

## 2022-02-03 LAB — PROTIME-INR
INR: 1.2 (ref 0.8–1.2)
Prothrombin Time: 14.7 seconds (ref 11.4–15.2)

## 2022-02-03 LAB — APTT: aPTT: 36 seconds (ref 24–36)

## 2022-02-03 LAB — PREGNANCY, URINE: Preg Test, Ur: NEGATIVE

## 2022-02-03 MED ORDER — DEXAMETHASONE SODIUM PHOSPHATE 10 MG/ML IJ SOLN
10.0000 mg | Freq: Once | INTRAMUSCULAR | Status: AC
  Administered 2022-02-03: 10 mg via INTRAVENOUS
  Filled 2022-02-03: qty 1

## 2022-02-03 MED ORDER — ONDANSETRON HCL 4 MG/2ML IJ SOLN
4.0000 mg | Freq: Once | INTRAMUSCULAR | Status: AC
  Administered 2022-02-03: 4 mg via INTRAVENOUS
  Filled 2022-02-03: qty 2

## 2022-02-03 MED ORDER — KETOROLAC TROMETHAMINE 15 MG/ML IJ SOLN
15.0000 mg | Freq: Once | INTRAMUSCULAR | Status: AC
  Administered 2022-02-03: 15 mg via INTRAVENOUS
  Filled 2022-02-03: qty 1

## 2022-02-03 MED ORDER — SODIUM CHLORIDE 0.9 % IV BOLUS
1000.0000 mL | Freq: Once | INTRAVENOUS | Status: AC
  Administered 2022-02-03: 1000 mL via INTRAVENOUS

## 2022-02-03 NOTE — ED Provider Notes (Signed)
? ?[facility] ?Provider Note: Lowella DellJ. Lane Kyera Felan, MD, FACEP ? ?CSN: [id number] ?MRN: [id number] ?ARRIVAL: 02/02/22 at 2250 ?ROOM: [facility] ? ? ?CHIEF COMPLAINT  ?Hemoptysis ? ? ?HISTORY OF PRESENT ILLNESS  ?02/03/22 12:01 AM ?Monique White is a 26 y.o. female who was seen on 01/28/2022 for 1 week of headache, cervical lymphadenopathy and 4 days of night sweats and fevers.  She did not have a sore throat at that time and tested negative for strep but positive for mononucleosis.  Liver enzymes were noted to be elevated.  ? ?She was seen by her PCP for sore throat and was given viscous lidocaine.  Since then she has been coughing up blood and small amounts.  She was seen at an urgent care yesterday and retested for strep (negative) but the physician at the urgent care was concerned "she may have something other than mono going on" because of the appearance of her throat.  She rates her sore throat as an 8 out of 10, worse with swallowing.  She has had decreased oral intake due to pain with swallowing.  She was noted to be febrile and tachycardic on arrival. ? ? ? ?Past Medical History:  ?Diagnosis Date  ? Seasonal allergies   ? ? ?Past Surgical History:  ?Procedure Laterality Date  ? ADENOIDECTOMY    ? ? ?No family history on file. ? ?Social History  ? ?Tobacco Use  ? Smoking status: Never  ?  Passive exposure: Never  ? Smokeless tobacco: Never  ?Vaping Use  ? Vaping Use: Never used  ?Substance Use Topics  ? Alcohol use: Yes  ?  Comment: occasional  ? Drug use: No  ? ? ?Prior to Admission medications   ?Medication Sig Start Date End Date Taking? Authorizing Provider  ?Cetirizine HCl (ZYRTEC PO) Take by mouth.    [provider]  ?fexofenadine (ALLEGRA) 180 MG tablet Take 180 mg by mouth daily.    [provider]  ?IRON PO Take by mouth.    [provider]  ?Multiple Vitamin (MULTIVITAMIN) capsule Take 1 capsule by mouth daily.    [provider]  ?ondansetron (ZOFRAN-ODT) 4 MG  disintegrating tablet 4mg  ODT q4 hours prn nausea/vomit 01/28/22   Mesner, Barbara CowerJason, MD  ? ? ?Allergies ?Hydrocodone ? ? ?REVIEW OF SYSTEMS  ?Negative except as noted here or in the History of Present Illness. ? ? ?PHYSICAL EXAMINATION  ?Initial Vital Signs ?Blood pressure 118/78, pulse (!) 129, temperature (!) 103 ?F (39.4 ?C), resp. rate 20, height 5\' 4"  (1.626 m), weight 55.3 kg, last menstrual period 01/23/2022, SpO2 96 %. ? ?Examination ?General: Well-developed, well-nourished female in no acute distress; appearance consistent with age of record ?HENT: normocephalic; atraumatic; uvula midline; tonsillar enlargement; erythema and dried blood in posterior oropharynx; hoarse voice ?Eyes: pupils equal, round and reactive to light; extraocular muscles intact ?Neck: supple; anterior and posterior cervical lymphadenopathy ?Heart: regular rate and rhythm tachycardia ?Lungs: clear to auscultation bilaterally ?Abdomen: soft; nondistended; nontender; left upper quadrant tenderness; bowel sounds present ?Extremities: No deformity; full range of motion; pulses normal ?Neurologic: Awake, alert and oriented; motor function intact in all extremities and symmetric; no facial droop ?Skin: Warm and dry ?Psychiatric: Flat affect ? ? ?RESULTS  ?Summary of this visit's results, reviewed and interpreted by myself: ? ? EKG Interpretation ? ?Date/Time:    ?Ventricular Rate:    ?PR Interval:    ?QRS Duration:   ?QT Interval:    ?QTC Calculation:   ?R Axis:     ?  Text Interpretation:   ?  ? ?  ? ?Laboratory Studies: ?Results for orders placed or performed during the hospital encounter of 02/02/22 (from the past 24 hour(s))  ?CBC with Differential/Platelet     Status: Abnormal  ? Collection Time: 02/03/22 12:16 AM  ?Result Value Ref Range  ? WBC 18.4 (H) 4.0 - 10.5 K/uL  ? RBC 3.98 3.87 - 5.11 MIL/uL  ? Hemoglobin 11.6 (L) 12.0 - 15.0 g/dL  ? HCT 33.7 (L) 36.0 - 46.0 %  ? MCV 84.7 80.0 - 100.0 fL  ? MCH 29.1 26.0 - 34.0 pg  ? MCHC 34.4 30.0 -  36.0 g/dL  ? RDW 13.2 11.5 - 15.5 %  ? Platelets 275 150 - 400 K/uL  ? nRBC 0.0 0.0 - 0.2 %  ? Neutrophils Relative % 29 %  ? Neutro Abs 5.4 1.7 - 7.7 K/uL  ? Lymphocytes Relative 62 %  ? Lymphs Abs 11.2 (H) 0.7 - 4.0 K/uL  ? Monocytes Relative 8 %  ? Monocytes Absolute 1.4 (H) 0.1 - 1.0 K/uL  ? Eosinophils Relative 0 %  ? Eosinophils Absolute 0.0 0.0 - 0.5 K/uL  ? Basophils Relative 1 %  ? Basophils Absolute 0.2 (H) 0.0 - 0.1 K/uL  ? WBC Morphology SMUDGE CELLS   ? RBC Morphology MORPHOLOGY UNREMARKABLE   ? Smear Review MORPHOLOGY UNREMARKABLE   ? Immature Granulocytes 0 %  ? Abs Immature Granulocytes 0.08 (H) 0.00 - 0.07 K/uL  ?Comprehensive metabolic panel     Status: Abnormal  ? Collection Time: 02/03/22 12:16 AM  ?Result Value Ref Range  ? Sodium 132 (L) 135 - 145 mmol/L  ? Potassium 4.3 3.5 - 5.1 mmol/L  ? Chloride 98 98 - 111 mmol/L  ? CO2 24 22 - 32 mmol/L  ? Glucose, Bld 95 70 - 99 mg/dL  ? BUN 7 6 - 20 mg/dL  ? Creatinine, Ser 0.70 0.44 - 1.00 mg/dL  ? Calcium 8.9 8.9 - 10.3 mg/dL  ? Total Protein 8.4 (H) 6.5 - 8.1 g/dL  ? Albumin 3.8 3.5 - 5.0 g/dL  ? AST 78 (H) 15 - 41 U/L  ? ALT 204 (H) 0 - 44 U/L  ? Alkaline Phosphatase 107 38 - 126 U/L  ? Total Bilirubin 0.8 0.3 - 1.2 mg/dL  ? GFR, Estimated >60 >60 mL/min  ? Anion gap 10 5 - 15  ?Protime-INR     Status: None  ? Collection Time: 02/03/22 12:16 AM  ?Result Value Ref Range  ? Prothrombin Time 14.7 11.4 - 15.2 seconds  ? INR 1.2 0.8 - 1.2  ?APTT     Status: None  ? Collection Time: 02/03/22 12:16 AM  ?Result Value Ref Range  ? aPTT 36 24 - 36 seconds  ?Pregnancy, urine     Status: None  ? Collection Time: 02/03/22 12:20 AM  ?Result Value Ref Range  ? Preg Test, Ur NEGATIVE NEGATIVE  ? ?Imaging Studies: ?DG Neck Soft Tissue ? ?Result Date: 02/03/2022 ?CLINICAL DATA:  Mononucleosis, sore throat EXAM: NECK SOFT TISSUES - 1+ VIEW COMPARISON:  None. FINDINGS: The tonsillar shadows are prominent bilaterally. The adenoidal soft tissues are within normal limits.  Retropharyngeal soft tissues are not thickened. The hypopharynx is not distended though there is subtle nodularity of the contour of the aerated hypopharynx suggesting submucosal edema within the hypopharynx. Epiglottis and aryepiglottic folds are normal. Subglottic airway is widely patent. Cervical kyphosis is likely positional in nature. IMPRESSION: Tonsillar enlargement. Subtle nodularity of the  contour the hypopharynx suggesting submucosal edema. This could be better assessed with direct visualization. Electronically Signed   By: Helyn Numbers M.D.   On: 02/03/2022 00:55   ? ?ED COURSE and MDM  ?Nursing notes, initial and subsequent vitals signs, including pulse oximetry, reviewed and interpreted by myself. ? ?Vitals:  ? 02/03/22 0000 02/03/22 0030 02/03/22 0119 02/03/22 0137  ?BP: 106/72 105/71  109/66  ?Pulse: (!) 120 (!) 124  (!) 117  ?Resp: 17 17  17   ?Temp:   100 ?F (37.8 ?C)   ?TempSrc:   Oral   ?SpO2: 99% 98%  94%  ?Weight:      ?Height:      ? ?Medications  ?sodium chloride 0.9 % bolus 1,000 mL (0 mLs Intravenous Stopped 02/03/22 0158)  ?ondansetron [facility]) injection 4 mg (4 mg Intravenous Given 02/03/22 0025)  ?ketorolac (TORADOL) 15 MG/ML injection 15 mg (15 mg Intravenous Given 02/03/22 0025)  ?dexamethasone (DECADRON) injection 10 mg (10 mg Intravenous Given 02/03/22 0155)  ?sodium chloride 0.9 % bolus 1,000 mL (1,000 mLs Intravenous New Bag/Given 02/03/22 0155)  ? ?1:41 AM ?We will give dexamethasone 10 mg IV to help reduce throat inflammation.  The throat is likely the source of her hemoptysis as there is irritation and bleeding seen.  She is not having any difficulty breathing or sense of airway occlusion. ? ?2:09 AM ?Patient feeling better and able to speak more clearly after hydration.  She was advised that mononucleosis can be a prolonged course (4 to 6 weeks) and she should not expect rapid relief of symptoms although the steroids may help her throat symptoms over the next several days.  She was  advised to call 911 should she experience any difficulty breathing or having a sensation of her throat closing up.  She was also advised to avoid any contact sports until she is fully recovered as there is

## 2022-02-04 LAB — PATHOLOGIST SMEAR REVIEW

## 2022-08-09 DIAGNOSIS — D473 Essential (hemorrhagic) thrombocythemia: Secondary | ICD-10-CM | POA: Diagnosis not present

## 2022-08-09 DIAGNOSIS — Z131 Encounter for screening for diabetes mellitus: Secondary | ICD-10-CM | POA: Diagnosis not present

## 2022-08-09 DIAGNOSIS — Z0000 Encounter for general adult medical examination without abnormal findings: Secondary | ICD-10-CM | POA: Diagnosis not present

## 2022-08-28 DIAGNOSIS — U071 COVID-19: Secondary | ICD-10-CM | POA: Diagnosis not present

## 2022-10-17 DIAGNOSIS — R590 Localized enlarged lymph nodes: Secondary | ICD-10-CM | POA: Diagnosis not present

## 2022-10-30 DIAGNOSIS — R591 Generalized enlarged lymph nodes: Secondary | ICD-10-CM | POA: Diagnosis not present

## 2023-03-04 IMAGING — CR DG NECK SOFT TISSUE
2 series · 2 of 2 positions shown · non-contrast
Comparison: None.

CLINICAL DATA: Mononucleosis, sore throat

EXAM:
NECK SOFT TISSUES - 1+ VIEW

[w soft tissue neck]
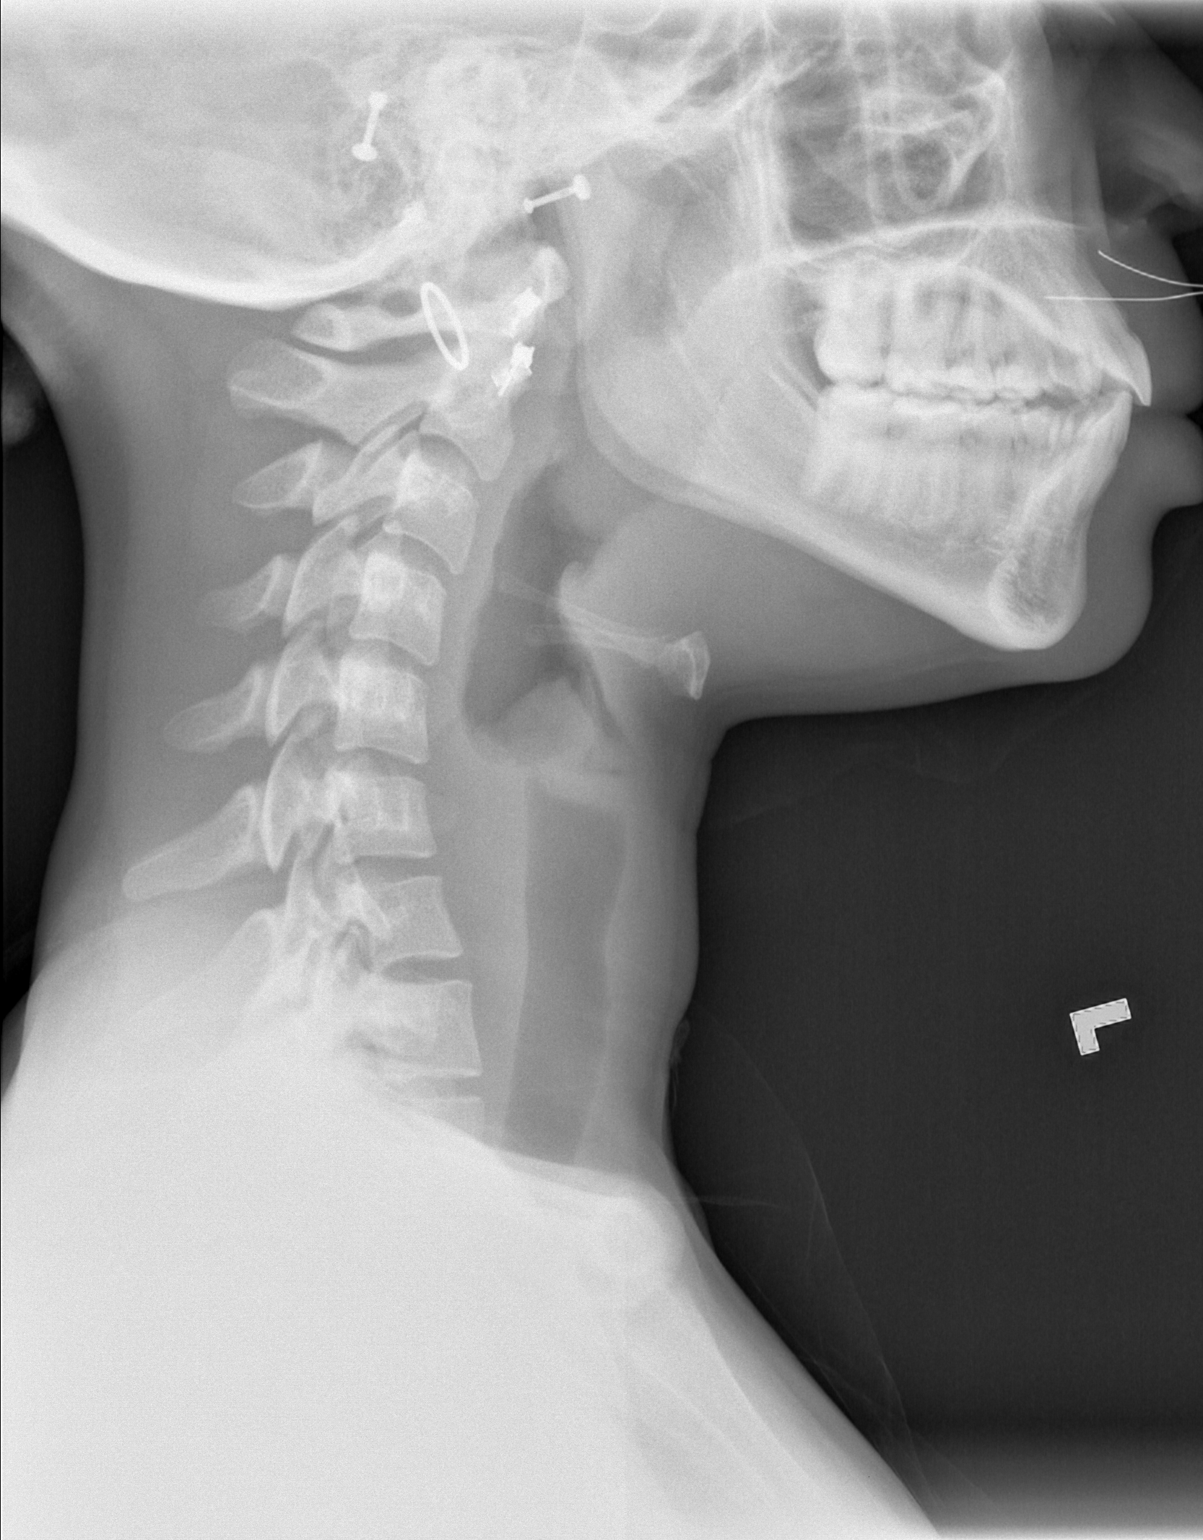

[w soft tissue neck ap]
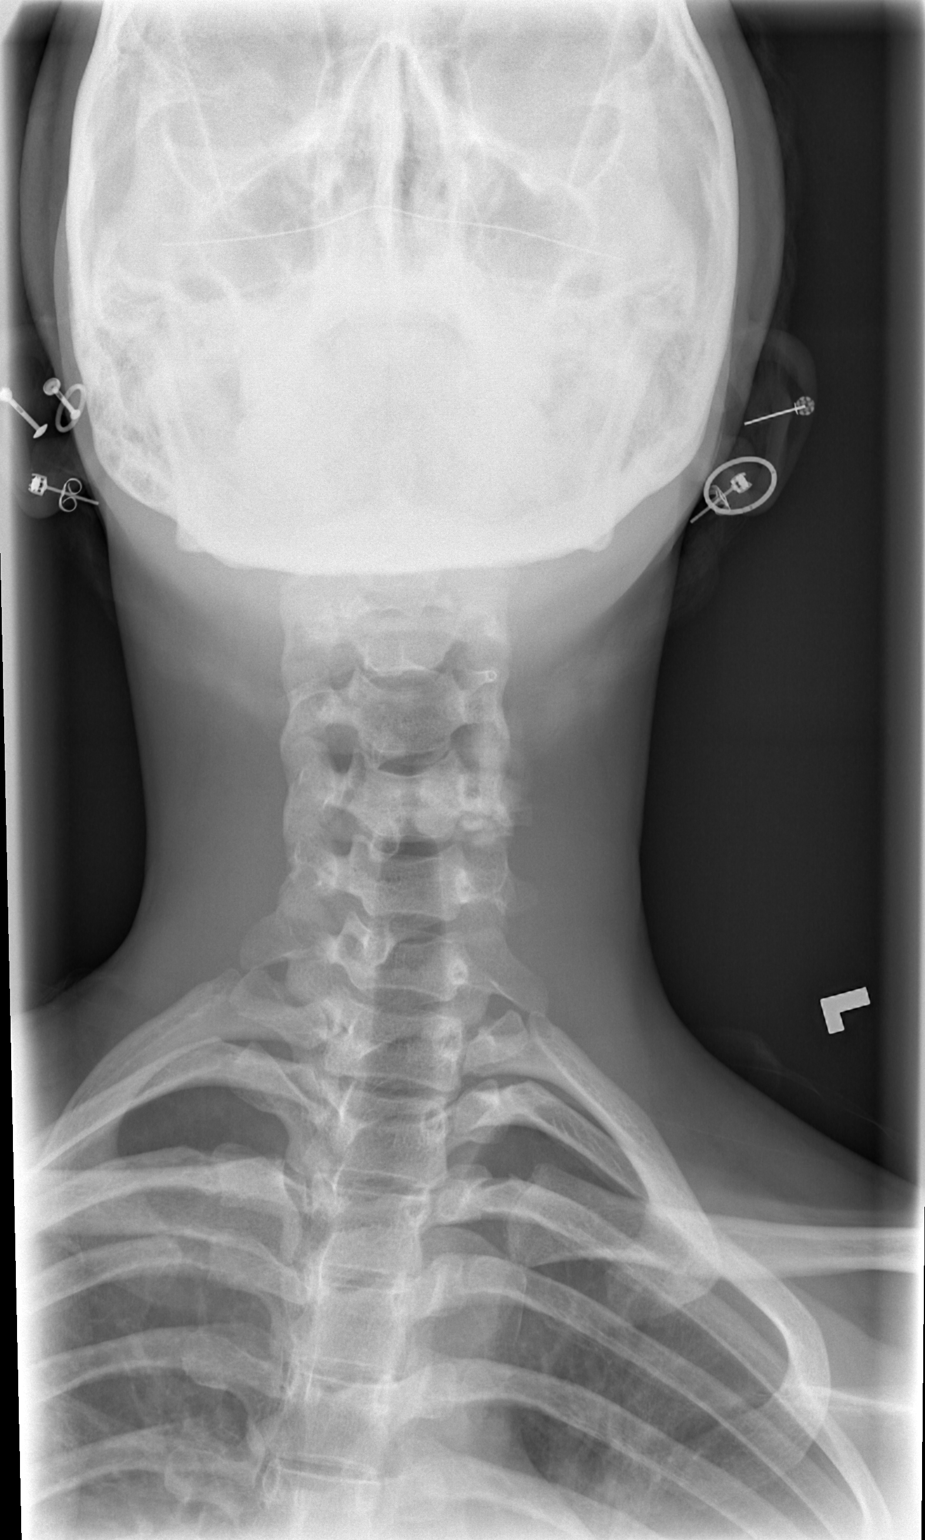

[2 of 2 positions shown; findings below may reference images not displayed]

FINDINGS: The tonsillar shadows are prominent bilaterally. The adenoidal soft
tissues are within normal limits. Retropharyngeal soft tissues are
not thickened. The hypopharynx is not distended though there is
subtle nodularity of the contour of the aerated hypopharynx
suggesting submucosal edema within the hypopharynx. Epiglottis and
aryepiglottic folds are normal. Subglottic airway is widely patent.
Cervical kyphosis is likely positional in nature.
IMPRESSION: Tonsillar enlargement.

Subtle nodularity of the contour the hypopharynx suggesting
submucosal edema. This could be better assessed with direct
visualization.
